# Patient Record
Sex: Female | Born: 1955 | Race: Black or African American | Hispanic: No | Smoking: Never smoker
Health system: Southern US, Community
[De-identification: ages and names within clinical notes are randomized; demographics above are authoritative.]

## PROBLEM LIST (undated history)

## (undated) DIAGNOSIS — C801 Malignant (primary) neoplasm, unspecified: Secondary | ICD-10-CM

## (undated) DIAGNOSIS — G249 Dystonia, unspecified: Secondary | ICD-10-CM

## (undated) HISTORY — PX: CHOLECYSTECTOMY: SHX55

## (undated) HISTORY — PX: MASTECTOMY: SHX3

## (undated) HISTORY — PX: BREAST SURGERY: SHX581

---

## 2021-02-02 ENCOUNTER — Emergency Department
Admission: EM | Admit: 2021-02-02 | Discharge: 2021-02-02 | Disposition: A | Discharge status: Home / Self Care | Payer: PRIVATE HEALTH INSURANCE | Attending: Emergency Medicine | Admitting: Emergency Medicine

## 2021-02-02 ENCOUNTER — Encounter: Payer: Self-pay | Admitting: Emergency Medicine

## 2021-02-02 ENCOUNTER — Other Ambulatory Visit: Payer: Self-pay

## 2021-02-02 ENCOUNTER — Emergency Department: Payer: PRIVATE HEALTH INSURANCE

## 2021-02-02 DIAGNOSIS — Z859 Personal history of malignant neoplasm, unspecified: Secondary | ICD-10-CM | POA: Insufficient documentation

## 2021-02-02 DIAGNOSIS — R55 Syncope and collapse: Secondary | ICD-10-CM | POA: Insufficient documentation

## 2021-02-02 DIAGNOSIS — I739 Peripheral vascular disease, unspecified: Secondary | ICD-10-CM | POA: Insufficient documentation

## 2021-02-02 DIAGNOSIS — Z20822 Contact with and (suspected) exposure to covid-19: Secondary | ICD-10-CM | POA: Diagnosis not present

## 2021-02-02 DIAGNOSIS — R000 Tachycardia, unspecified: Secondary | ICD-10-CM | POA: Diagnosis not present

## 2021-02-02 HISTORY — DX: Dystonia, unspecified: G24.9

## 2021-02-02 HISTORY — DX: Malignant (primary) neoplasm, unspecified: C80.1

## 2021-02-02 LAB — URINALYSIS, ROUTINE W REFLEX MICROSCOPIC
Bacteria, UA: NONE SEEN
Bilirubin Urine: NEGATIVE
Glucose, UA: NEGATIVE mg/dL
Hgb urine dipstick: NEGATIVE
Ketones, ur: NEGATIVE mg/dL
Nitrite: NEGATIVE
Protein, ur: NEGATIVE mg/dL
Specific Gravity, Urine: 1.006 (ref 1.005–1.030)
pH: 6 (ref 5.0–8.0)

## 2021-02-02 LAB — RESP PANEL BY RT-PCR (FLU A&B, COVID) ARPGX2
Influenza A by PCR: NEGATIVE
Influenza B by PCR: NEGATIVE
SARS Coronavirus 2 by RT PCR: NEGATIVE

## 2021-02-02 LAB — COMPREHENSIVE METABOLIC PANEL
ALT: 27 U/L (ref 0–44)
AST: 23 U/L (ref 15–41)
Albumin: 4.4 g/dL (ref 3.5–5.0)
Alkaline Phosphatase: 69 U/L (ref 38–126)
Anion gap: 10 (ref 5–15)
BUN: 15 mg/dL (ref 8–23)
CO2: 29 mmol/L (ref 22–32)
Calcium: 9.9 mg/dL (ref 8.9–10.3)
Chloride: 100 mmol/L (ref 98–111)
Creatinine, Ser: 0.89 mg/dL (ref 0.44–1.00)
GFR, Estimated: >60 mL/min (ref >60)
Glucose, Bld: 151 mg/dL — ABNORMAL HIGH (ref 70–99)
Potassium: 3.5 mmol/L (ref 3.5–5.1)
Sodium: 139 mmol/L (ref 135–145)
Total Bilirubin: 0.9 mg/dL (ref 0.3–1.2)
Total Protein: 7.5 g/dL (ref 6.5–8.1)

## 2021-02-02 LAB — CBC
HCT: 42.9 % (ref 36.0–46.0)
Hemoglobin: 14.9 g/dL (ref 12.0–15.0)
MCH: 31 pg (ref 26.0–34.0)
MCHC: 34.7 g/dL (ref 30.0–36.0)
MCV: 89.4 fL (ref 80.0–100.0)
Platelets: 245 10*3/uL (ref 150–400)
RBC: 4.8 MIL/uL (ref 3.87–5.11)
RDW: 13.3 % (ref 11.5–15.5)
WBC: 7.4 10*3/uL (ref 4.0–10.5)
nRBC: 0 % (ref 0.0–0.2)

## 2021-02-02 LAB — LIPASE, BLOOD: Lipase: 34 U/L (ref 11–51)

## 2021-02-02 LAB — TROPONIN I (HIGH SENSITIVITY): Troponin I (High Sensitivity): 6 ng/L (ref <18)

## 2021-02-02 IMAGING — MR MR MRA HEAD W/O CM
1 series · 19 of 48 positions shown · non-contrast
Comparison: None.

CLINICAL DATA: Nausea and vomiting beginning yesterday.



[Series 5: TOF · axial · 0.5mm · 0.41mm/px · z∈[-57,+39]mm · 19 of 205 slices shown]
[im 1/205]
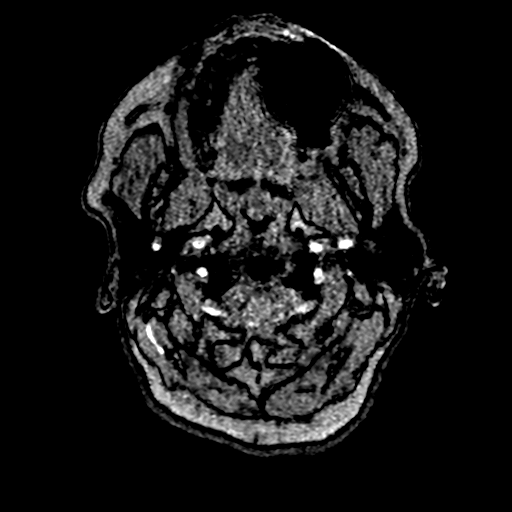
[im 5/205]
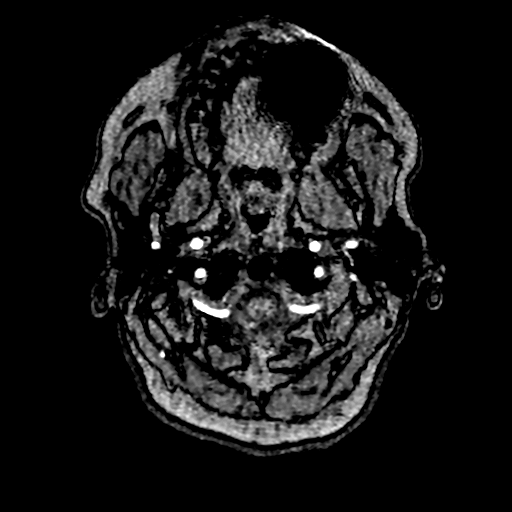
[im 9/205]
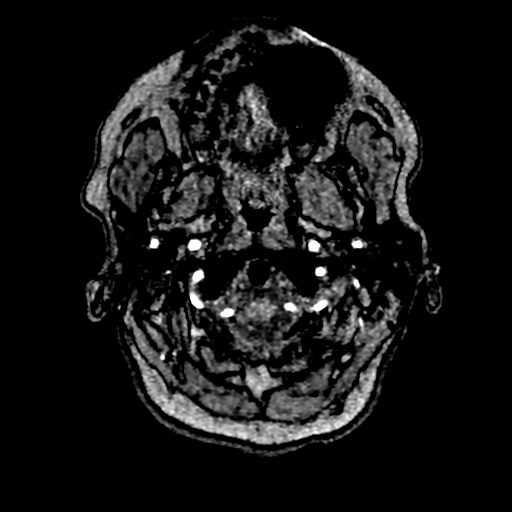
[im 14/205]
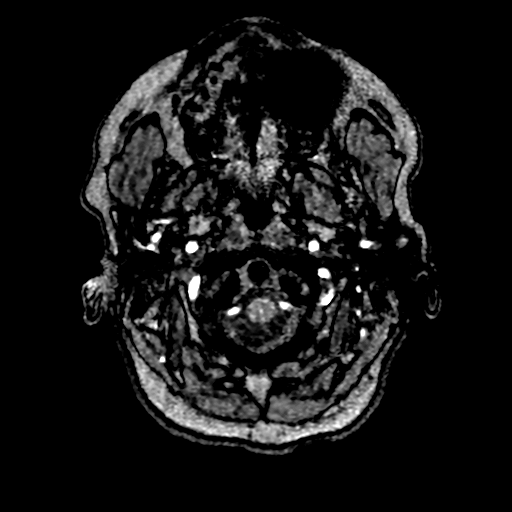
[im 18/205]
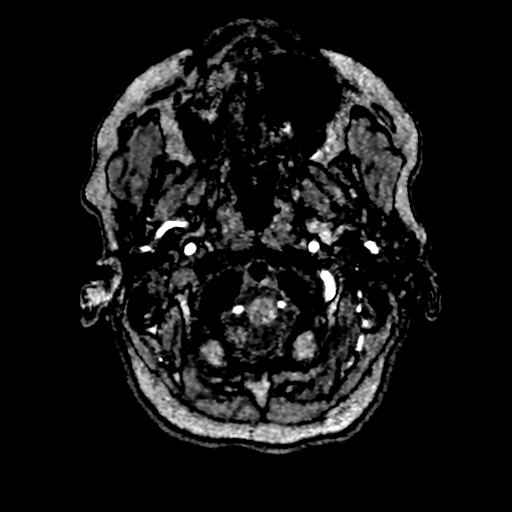
[im 22/205]
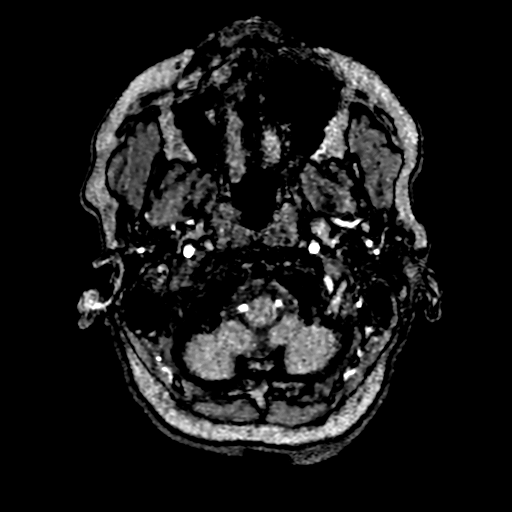
[im 27/205]
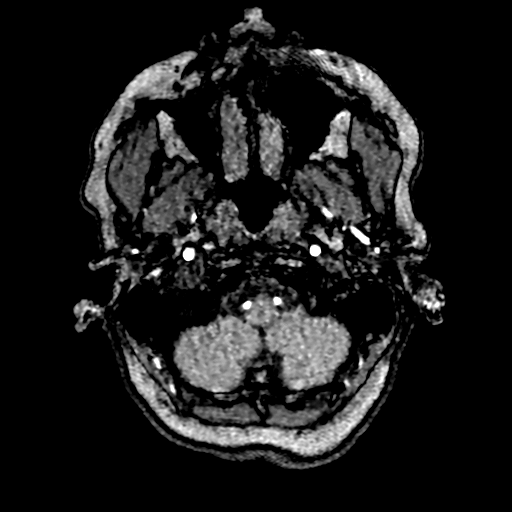
[im 31/205]
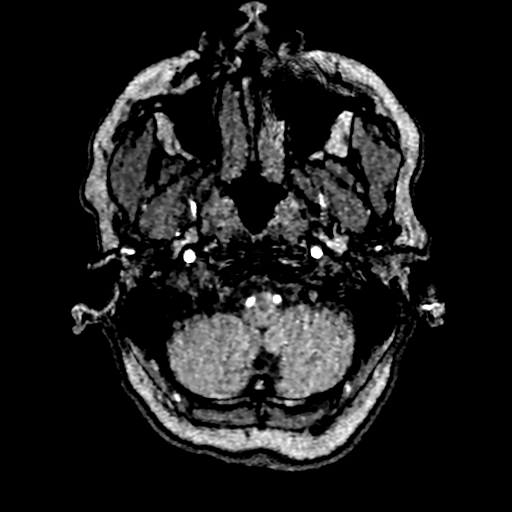
[im 35/205]
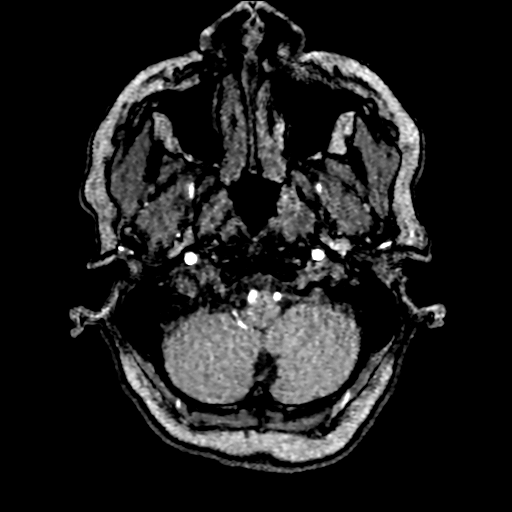
[im 40/205]
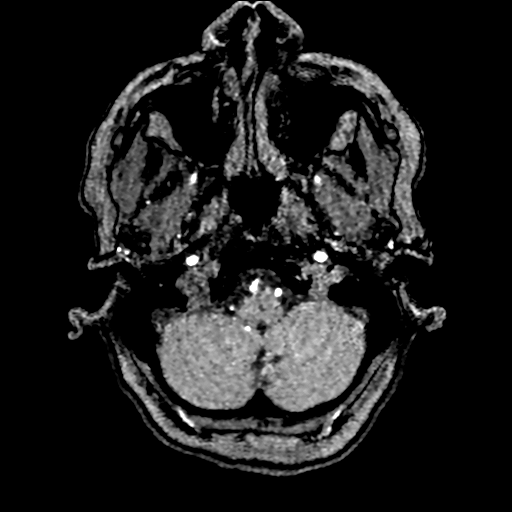
[im 44/205]
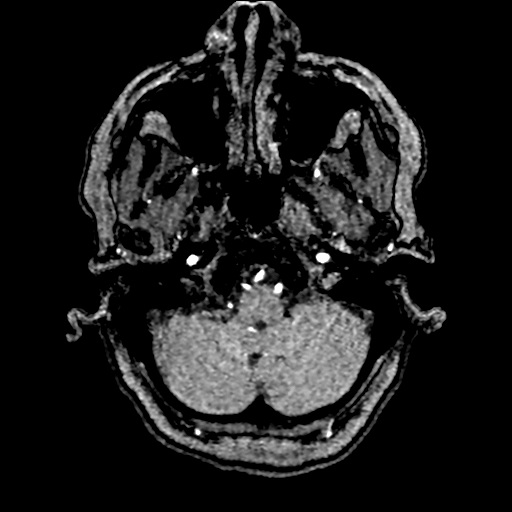
[im 66/205]
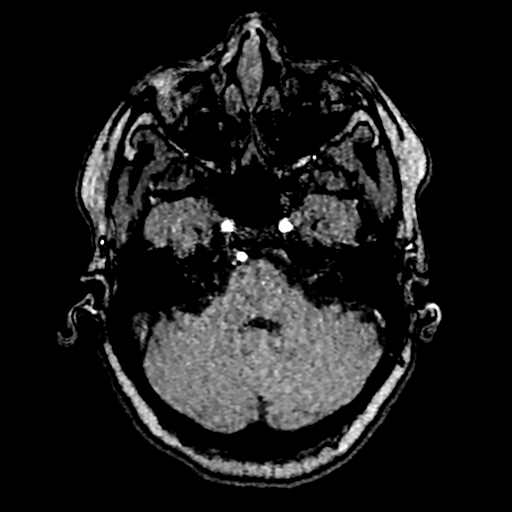
[im 92/205]
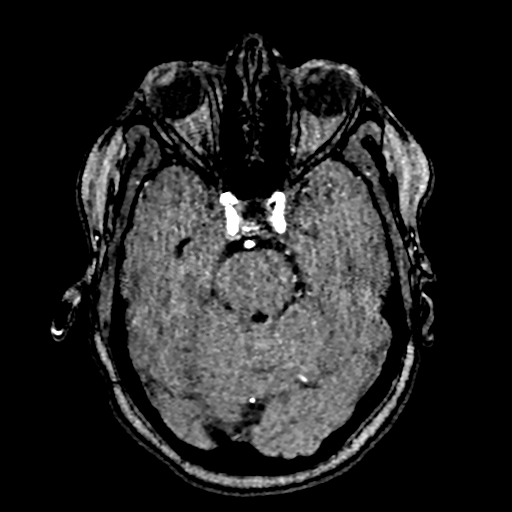
[im 105/205]
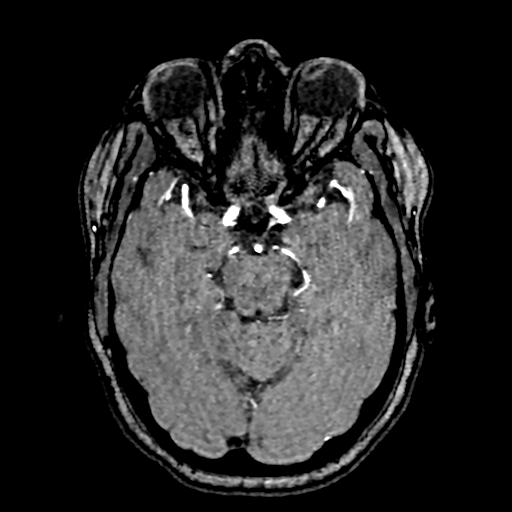
[im 118/205]
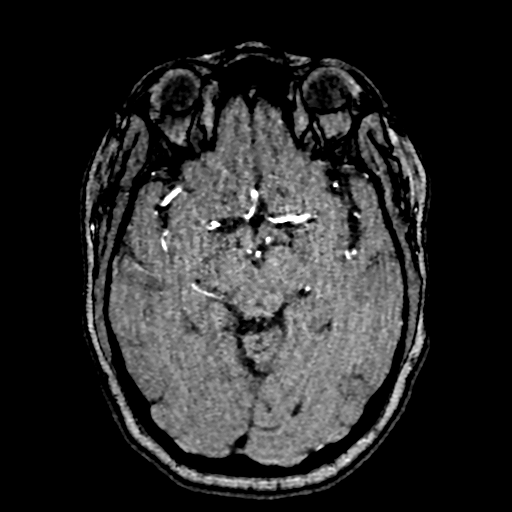
[im 144/205]
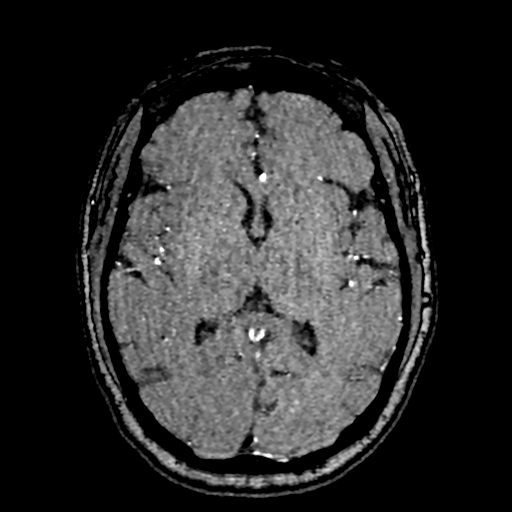
[im 170/205]
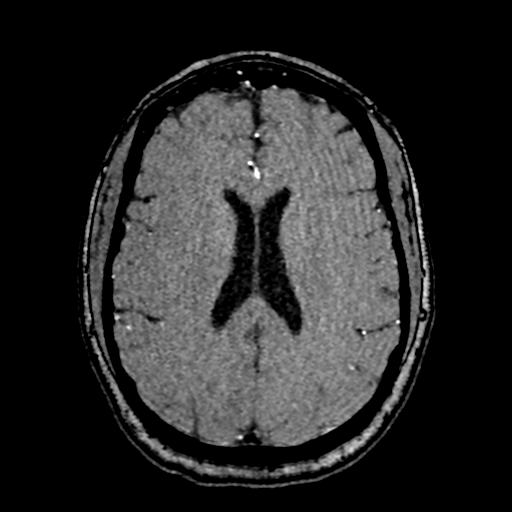
[im 174/205]
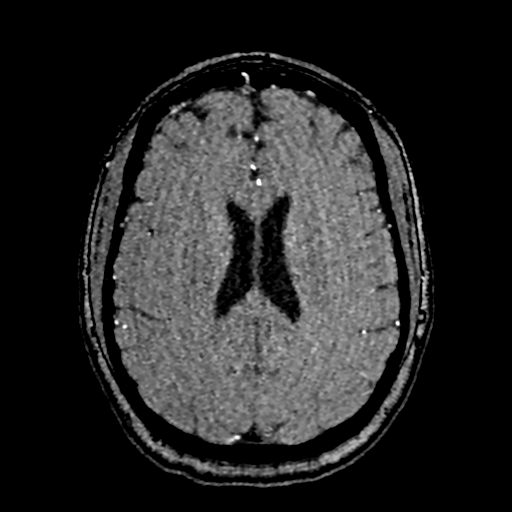
[im 196/205]
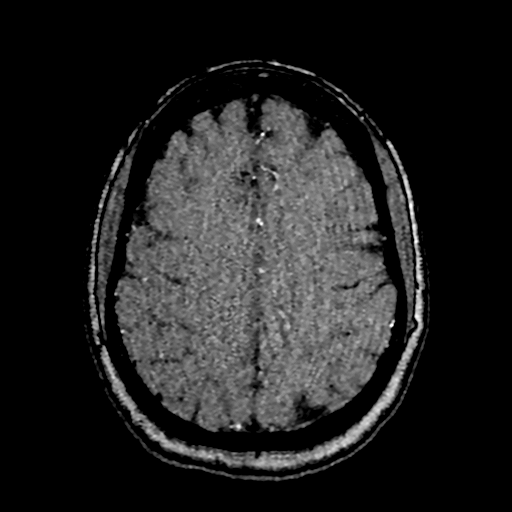

[19 of 48 positions shown; findings below may reference images not displayed]

FINDINGS: MRI HEAD FINDINGS

Brain: Diffusion imaging does not show any acute or subacute
infarction. There chronic small-vessel ischemic changes of the pons.
No focal cerebellar insult. Cerebral hemispheres show moderate
chronic small-vessel ischemic changes of the deep and subcortical
white matter. No cortical or large vessel territory infarction. No
mass lesion, hemorrhage, hydrocephalus or extra-axial collection.

Vascular: Major vessels at the base of the brain show flow.

Skull and upper cervical spine: Negative

Sinuses/Orbits: Clear/normal

Other: None

MRA HEAD FINDINGS

Both internal carotid arteries are widely patent into the brain. No
siphon stenosis. The anterior and middle cerebral vessels are patent
without proximal stenosis, aneurysm or vascular malformation.

Both vertebral arteries are widely patent to the basilar. No basilar
stenosis. Posterior circulation branch vessels appear normal.

MRA NECK FINDINGS

Both common carotid arteries patent from the midportion to the
bifurcations. Both carotid bifurcations appear normal without
stenosis or irregularity. Antegrade flow seen in normal sized
vertebral arteries. Vessel origins not studied using the noncontrast
technique.
IMPRESSION: MRI head: No acute finding. Moderate chronic small-vessel ischemic
changes of the pons and cerebral hemispheric white matter.

MRA head: Normal appearance of the large and medium sized vessels.

MRA neck: No carotid bifurcation disease on either side. Antegrade
flow in normal sized vertebral arteries.

## 2021-02-02 IMAGING — MR MR HEAD W/O CM
13 series · 48 of 48 positions shown · non-contrast
Comparison: None.

CLINICAL DATA: Nausea and vomiting beginning yesterday.



[Series 5: ax dwi_tracew · axial · 3.0mm · 0.65mm/px · z∈[-62,+91]mm · 2 of 48 slices shown]
[im 1/48]
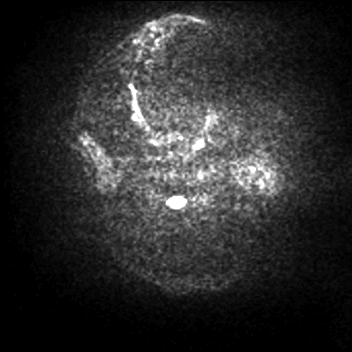
[im 48/48]
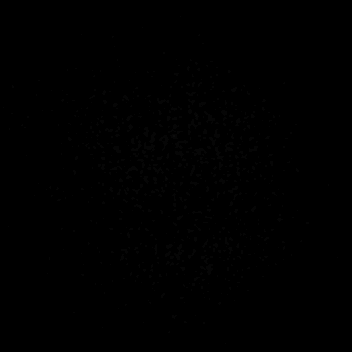

[Series 6: ax dwi_adc · axial · 3.0mm · 0.65mm/px · z∈[-62,+81]mm · 2 of 45 slices shown]
[im 1/45]
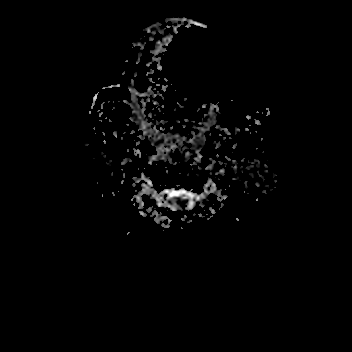
[im 45/45]
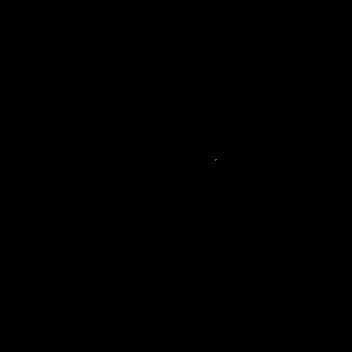

[Series 7: cor dwi_tracew · coronal · 5.0mm · 0.68mm/px · 3 of 40 slices shown]
[im 1/40]
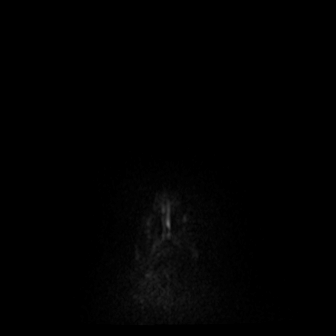
[im 20/40]
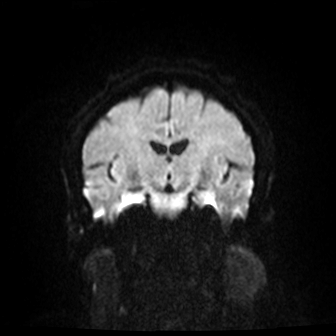
[im 40/40]
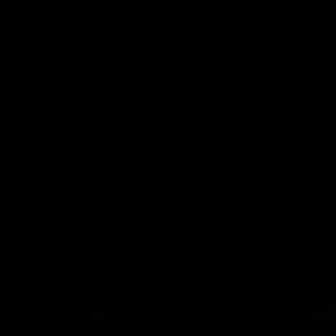

[Series 8: cor dwi_adc · coronal · 5.0mm · 0.68mm/px · 3 of 39 slices shown]
[im 1/39]
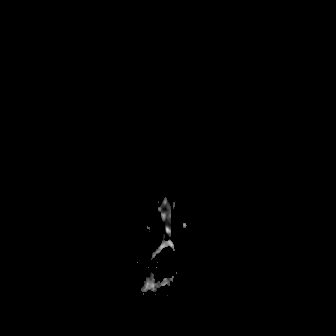
[im 20/39]
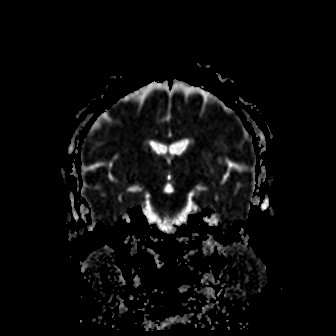
[im 39/39]
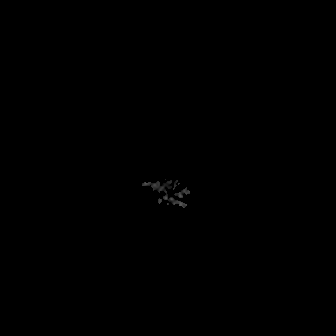

[Series 9: T1 · sagittal · 5.0mm · 0.62mm/px · 2 of 25 slices shown (1 of 2)]
[im 1/25]
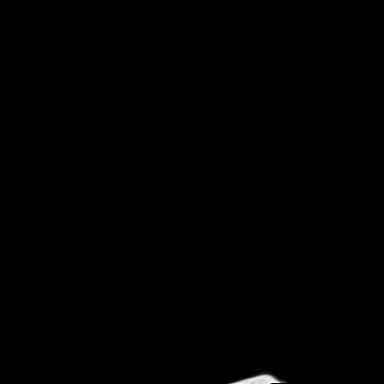
[im 25/25]
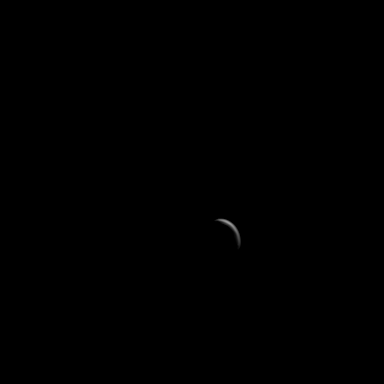

[Series 10: T2 · axial · 5.0mm · 0.53mm/px · z∈[-57,+85]mm · 2 of 25 slices shown (1 of 2)]
[im 1/25]
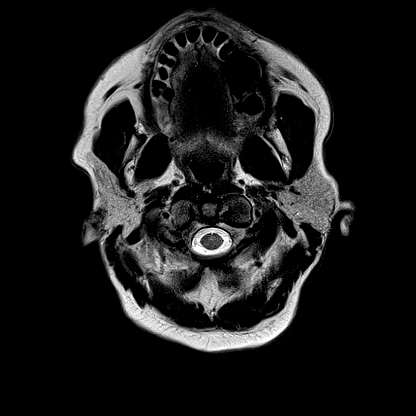
[im 25/25]
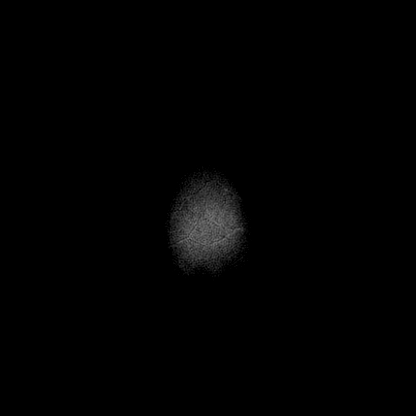

[Series 11: mag_images · axial · 3.0mm · 0.90mm/px · z∈[-74,+100]mm · 4 of 60 slices shown]
[im 1/60]
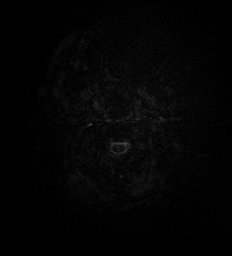
[im 20/60]
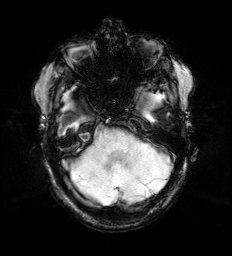
[im 40/60]
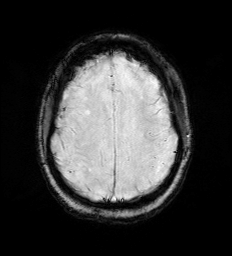
[im 60/60]
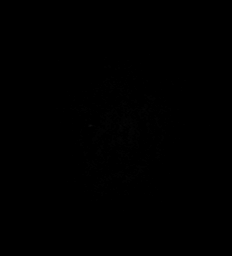

[Series 12: pha_images · axial · 3.0mm · 0.90mm/px · z∈[-74,+100]mm · 4 of 59 slices shown]
[im 1/59]
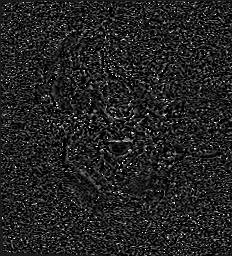
[im 20/59]
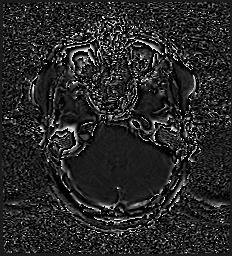
[im 39/59]
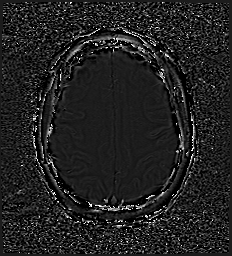
[im 59/59]
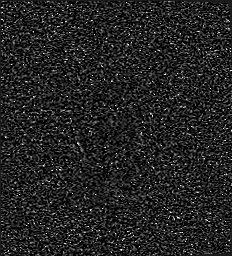

[Series 13: swi_images · axial · 3.0mm · 0.90mm/px · z∈[-74,+100]mm · 4 of 60 slices shown]
[im 1/60]
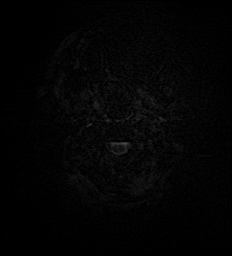
[im 20/60]
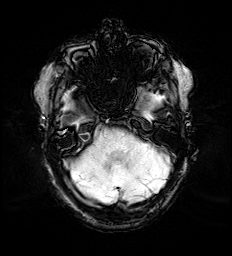
[im 40/60]
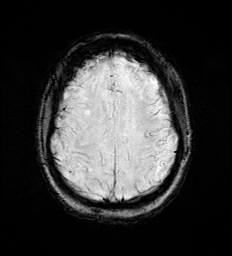
[im 60/60]
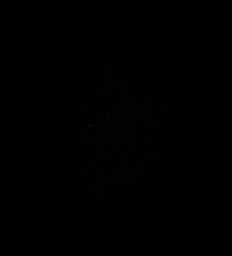

[Series 14: mip_images(sw) · axial · 24.0mm · 0.90mm/px · z∈[-64,+89]mm · 4 of 53 slices shown]
[im 1/53]
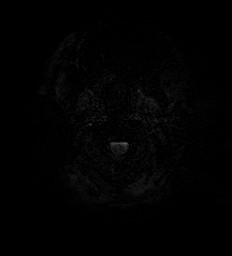
[im 18/53]
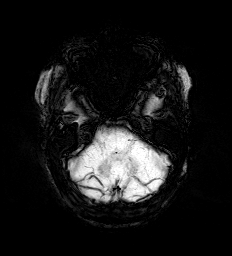
[im 35/53]
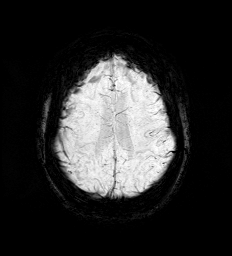
[im 53/53]
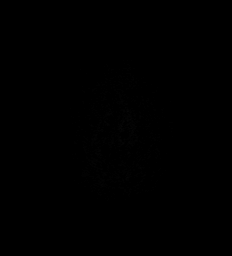

[Series 15: FLAIR · axial · 3.0mm · 0.69mm/px · z∈[-66,+94]mm · 4 of 55 slices shown]
[im 1/55]
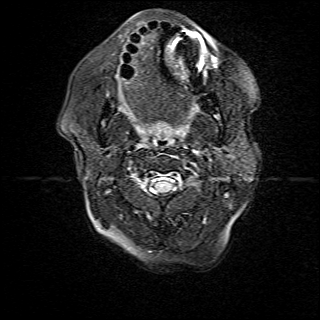
[im 19/55]
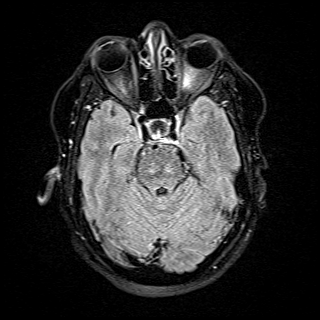
[im 37/55]
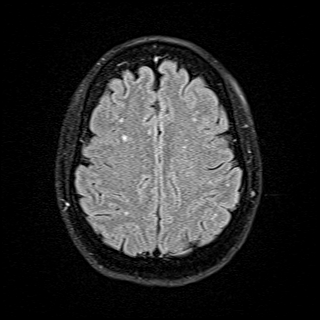
[im 55/55]
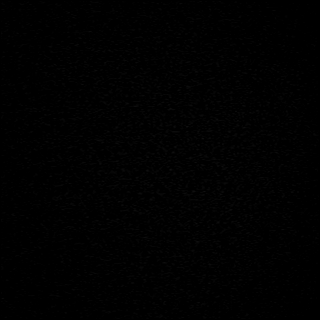

[Series 16: T1 · axial · 1.0mm · 0.98mm/px · z∈[-72,+100]mm · 12 of 176 slices shown (2 of 2)]
[im 1/176]
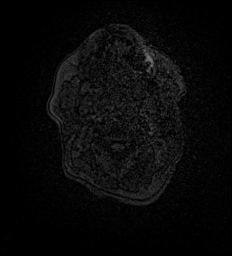
[im 16/176]
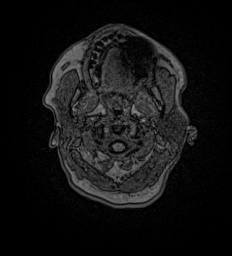
[im 32/176]
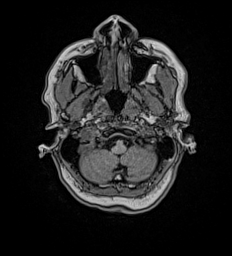
[im 48/176]
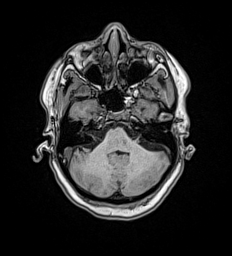
[im 64/176]
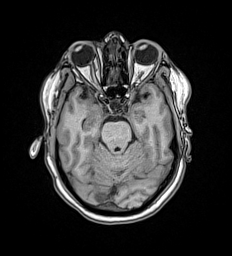
[im 80/176]
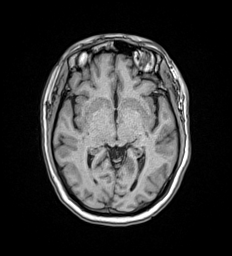
[im 96/176]
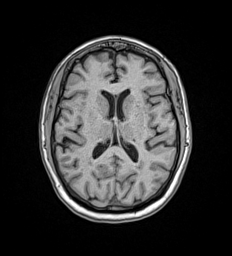
[im 112/176]
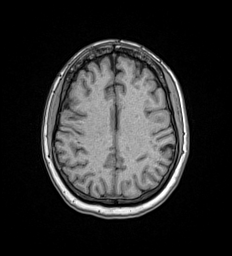
[im 128/176]
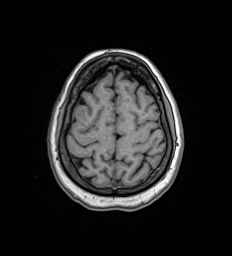
[im 144/176]
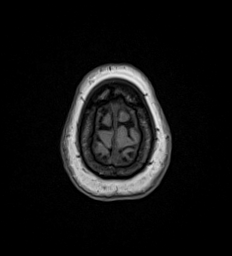
[im 160/176]
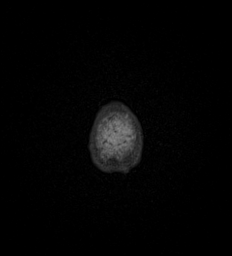
[im 176/176]
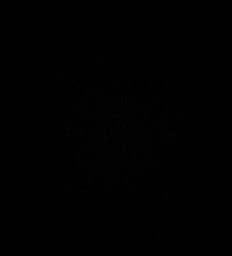

[Series 17: T2 · coronal · 5.0mm · 0.45mm/px · 2 of 31 slices shown (2 of 2)]
[im 1/31]
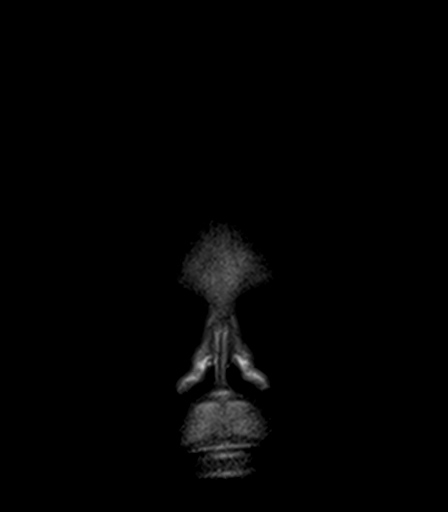
[im 31/31]
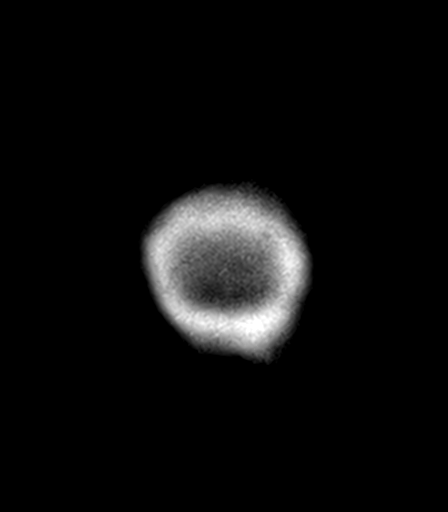

[48 of 48 positions shown; findings below may reference images not displayed]

FINDINGS: MRI HEAD FINDINGS

Brain: Diffusion imaging does not show any acute or subacute
infarction. There chronic small-vessel ischemic changes of the pons.
No focal cerebellar insult. Cerebral hemispheres show moderate
chronic small-vessel ischemic changes of the deep and subcortical
white matter. No cortical or large vessel territory infarction. No
mass lesion, hemorrhage, hydrocephalus or extra-axial collection.

Vascular: Major vessels at the base of the brain show flow.

Skull and upper cervical spine: Negative

Sinuses/Orbits: Clear/normal

Other: None

MRA HEAD FINDINGS

Both internal carotid arteries are widely patent into the brain. No
siphon stenosis. The anterior and middle cerebral vessels are patent
without proximal stenosis, aneurysm or vascular malformation.

Both vertebral arteries are widely patent to the basilar. No basilar
stenosis. Posterior circulation branch vessels appear normal.

MRA NECK FINDINGS

Both common carotid arteries patent from the midportion to the
bifurcations. Both carotid bifurcations appear normal without
stenosis or irregularity. Antegrade flow seen in normal sized
vertebral arteries. Vessel origins not studied using the noncontrast
technique.
IMPRESSION: MRI head: No acute finding. Moderate chronic small-vessel ischemic
changes of the pons and cerebral hemispheric white matter.

MRA head: Normal appearance of the large and medium sized vessels.

MRA neck: No carotid bifurcation disease on either side. Antegrade
flow in normal sized vertebral arteries.

## 2021-02-02 IMAGING — MR MR MRA NECK W/O CM
1 series · 39 of 48 positions shown · non-contrast
Comparison: None.

CLINICAL DATA: Nausea and vomiting beginning yesterday.



[Series 6: TOF · axial · 0.6mm · 0.52mm/px · z∈[-127,-53]mm · 39 of 133 slices shown]
[im 1/133]
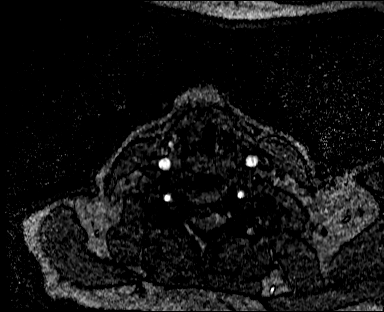
[im 3/133]
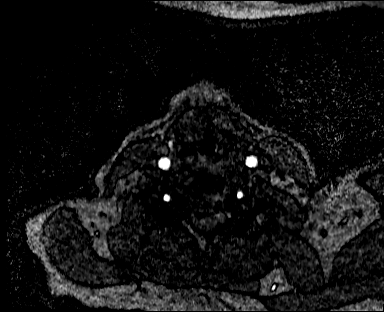
[im 6/133]
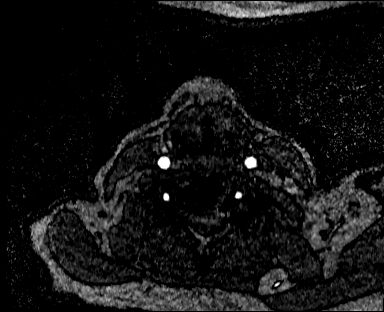
[im 9/133]
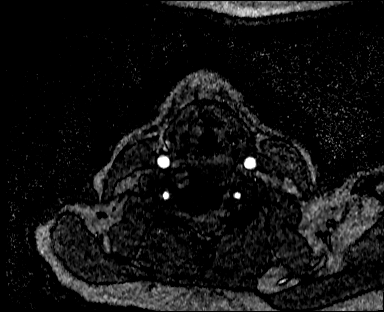
[im 12/133]
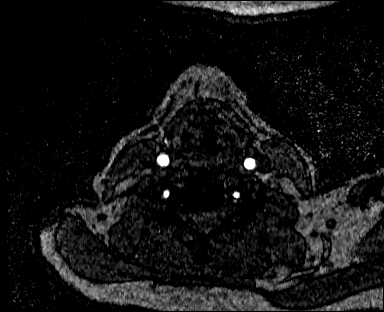
[im 15/133]
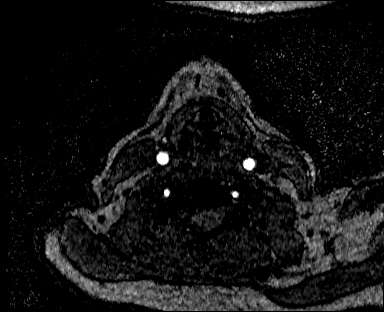
[im 17/133]
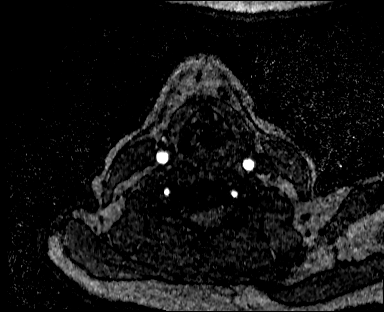
[im 20/133]
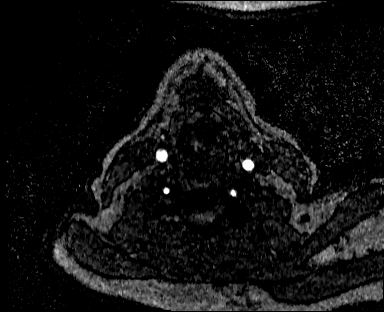
[im 23/133]
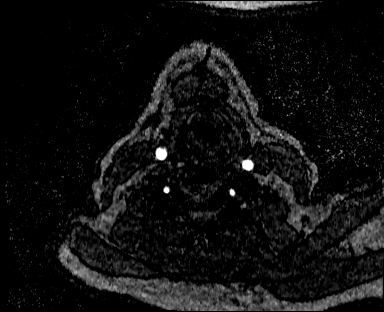
[im 26/133]
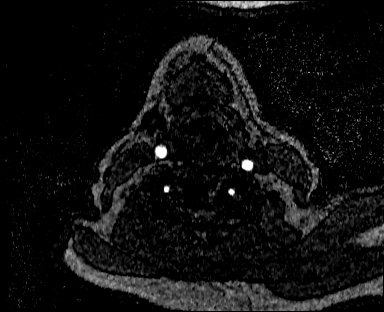
[im 29/133]
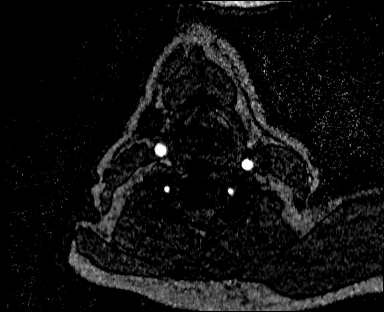
[im 31/133]
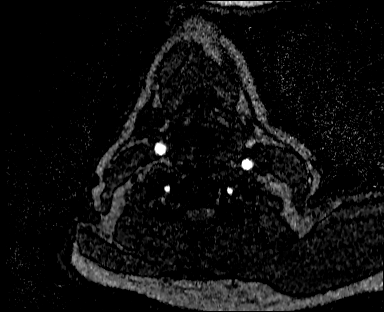
[im 34/133]
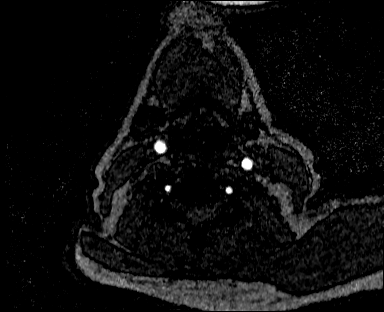
[im 37/133]
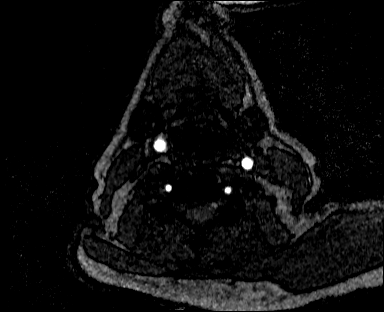
[im 40/133]
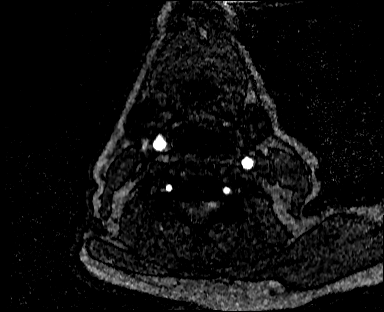
[im 43/133]
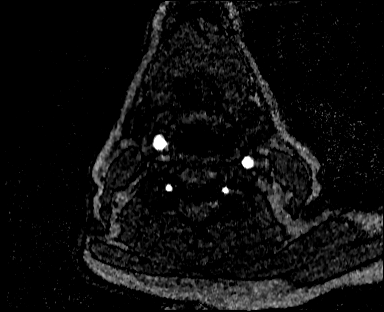
[im 45/133]
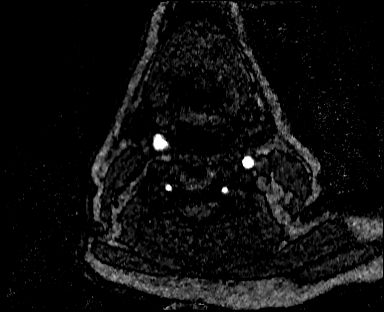
[im 48/133]
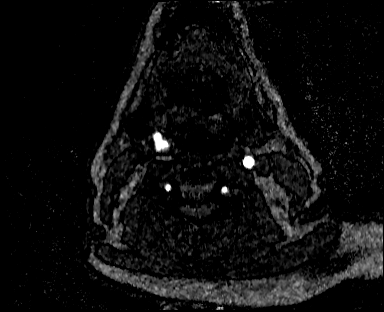
[im 51/133]
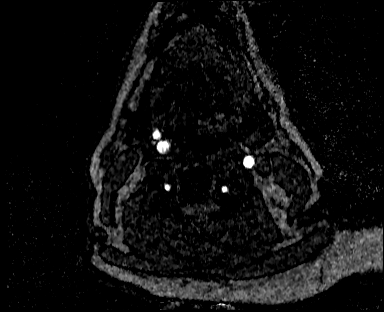
[im 54/133]
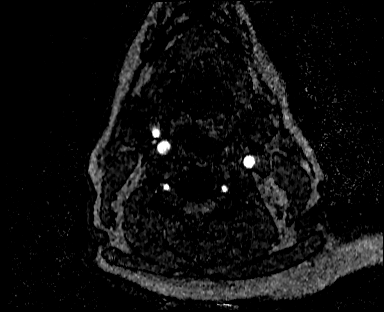
[im 57/133]
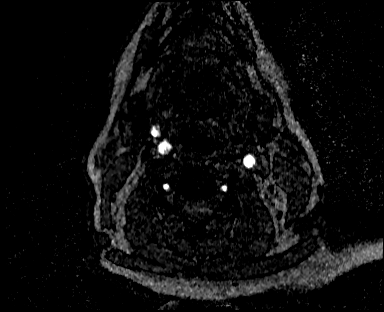
[im 59/133]
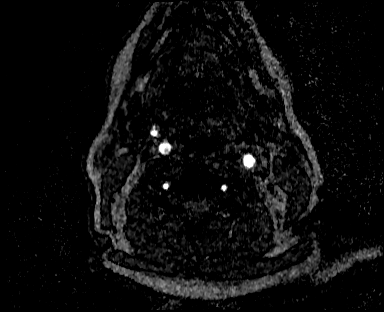
[im 62/133]
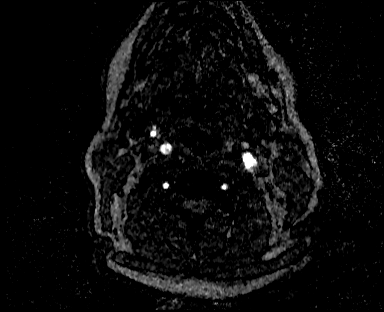
[im 65/133]
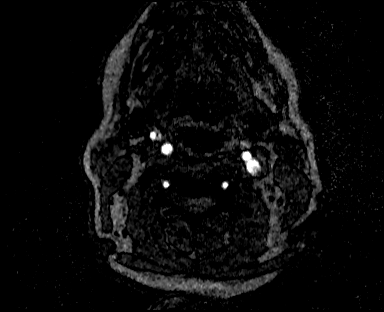
[im 68/133]
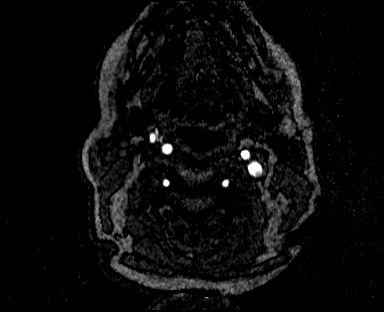
[im 71/133]
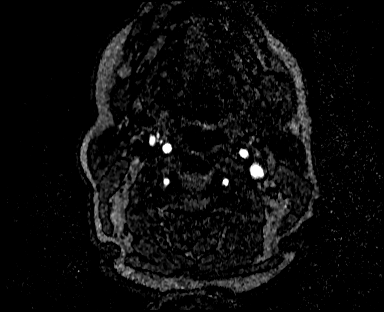
[im 74/133]
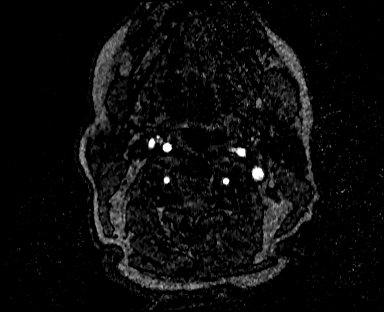
[im 76/133]
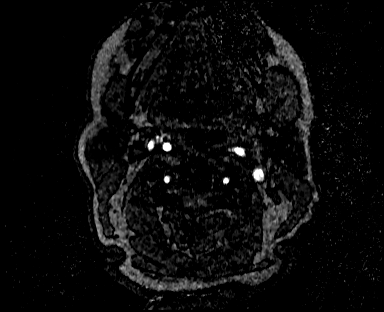
[im 79/133]
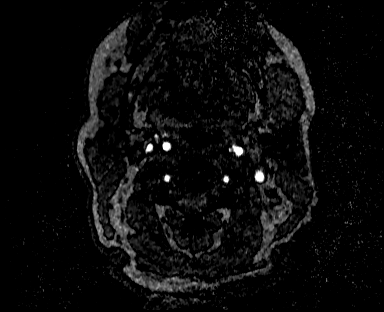
[im 82/133]
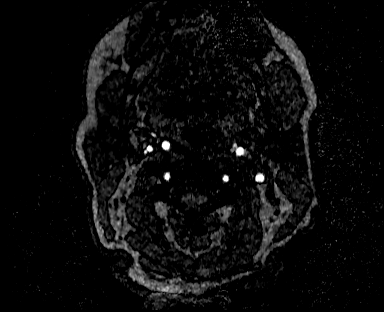
[im 85/133]
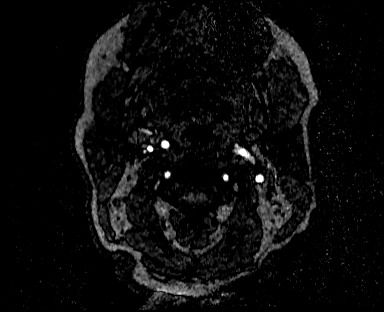
[im 88/133]
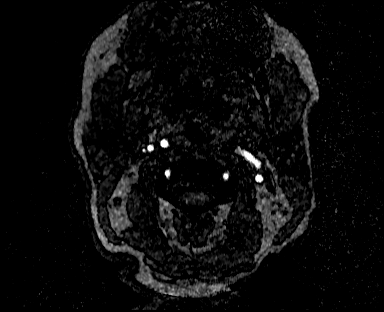
[im 90/133]
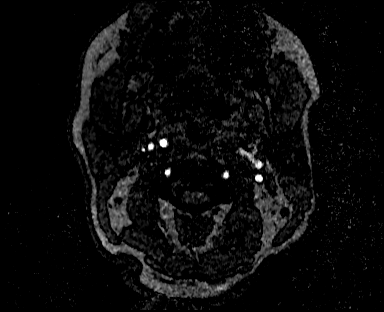
[im 93/133]
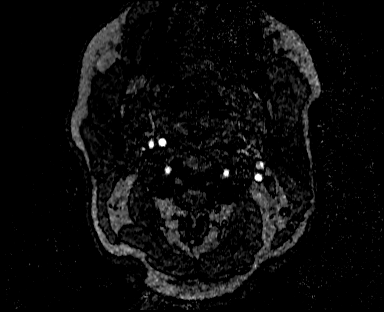
[im 96/133]
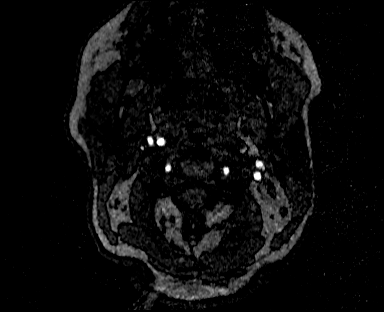
[im 99/133]
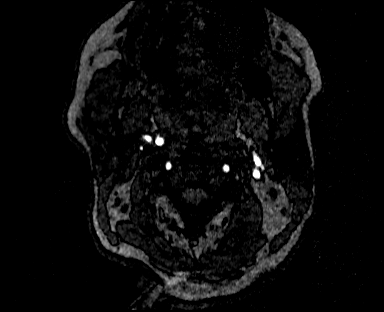
[im 110/133]
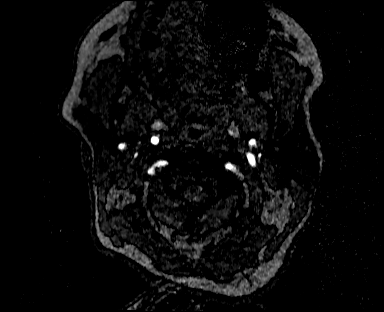
[im 113/133]
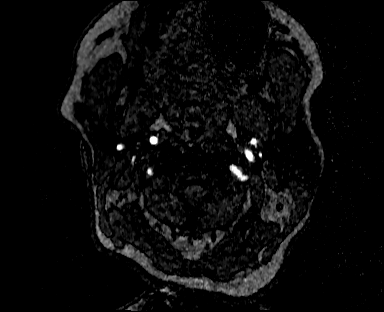
[im 127/133]
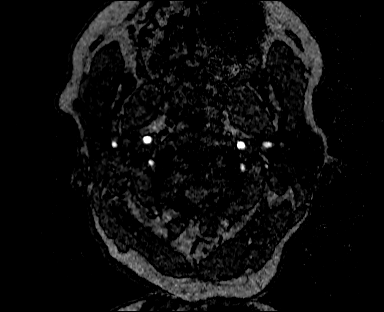

[39 of 48 positions shown; findings below may reference images not displayed]

FINDINGS: MRI HEAD FINDINGS

Brain: Diffusion imaging does not show any acute or subacute
infarction. There chronic small-vessel ischemic changes of the pons.
No focal cerebellar insult. Cerebral hemispheres show moderate
chronic small-vessel ischemic changes of the deep and subcortical
white matter. No cortical or large vessel territory infarction. No
mass lesion, hemorrhage, hydrocephalus or extra-axial collection.

Vascular: Major vessels at the base of the brain show flow.

Skull and upper cervical spine: Negative

Sinuses/Orbits: Clear/normal

Other: None

MRA HEAD FINDINGS

Both internal carotid arteries are widely patent into the brain. No
siphon stenosis. The anterior and middle cerebral vessels are patent
without proximal stenosis, aneurysm or vascular malformation.

Both vertebral arteries are widely patent to the basilar. No basilar
stenosis. Posterior circulation branch vessels appear normal.

MRA NECK FINDINGS

Both common carotid arteries patent from the midportion to the
bifurcations. Both carotid bifurcations appear normal without
stenosis or irregularity. Antegrade flow seen in normal sized
vertebral arteries. Vessel origins not studied using the noncontrast
technique.
IMPRESSION: MRI head: No acute finding. Moderate chronic small-vessel ischemic
changes of the pons and cerebral hemispheric white matter.

MRA head: Normal appearance of the large and medium sized vessels.

MRA neck: No carotid bifurcation disease on either side. Antegrade
flow in normal sized vertebral arteries.

## 2021-02-02 NOTE — ED Provider Notes (Signed)
[facility] Emergency Department Provider Note  Time seen: 8:52 AM  I have reviewed the triage vital signs and the nursing notes.   HISTORY  Chief Complaint Near Syncope   HPI Alison Dalton is a 65 y.o. female with a past medical history of cancer, presents to the emergency department for near syncope.  According to the patient last night she had a weird sensation in her head followed by a near syncopal episode where she felt like she was going to pass out/get lightheaded.  Patient states she has continued to feel intermittently lightheaded since and this morning she was nauseated and had an episode of vomiting.  Denies any headache or head pain currently.  Denies any weakness or numbness of any arm or leg at any point.  No history of aneurysms.  Denies chest pain or palpitations at any point.  No shortness of breath cough congestion or fever.  Past Medical History:  Diagnosis Date   Cancer ([place])    Dystonia     There are no problems to display for this patient.   Past Surgical History:  Procedure Laterality Date   BREAST SURGERY     CHOLECYSTECTOMY     MASTECTOMY Left     Prior to Admission medications   Not on File    Allergies  Allergen Reactions   Contrast Media [Iodinated Diagnostic Agents]    Penicillins     No family history on file.  Social History Social History   Tobacco Use   Smoking status: Never   Smokeless tobacco: Never  Substance Use Topics   Alcohol use: Not Currently   Drug use: Never    Review of Systems Constitutional: Negative for fever.  Lightheaded at times. Cardiovascular: Negative for chest pain. Respiratory: Negative for shortness of breath. Gastrointestinal: Negative for abdominal pain.  Nausea and vomiting this morning. Genitourinary: Negative for urinary compaints Musculoskeletal: Negative for musculoskeletal complaints Neurological: Strange had discomfort last night followed by near syncope, none  since. All other ROS negative  ____________________________________________   PHYSICAL EXAM:  VITAL SIGNS: ED Triage Vitals  Enc Vitals Group     BP 02/02/21 0811 (!) 160/95     Pulse Rate 02/02/21 0811 88     Resp 02/02/21 0811 16     Temp 02/02/21 0811 97.8 F (36.6 C)     Temp Source 02/02/21 0811 Oral     SpO2 02/02/21 0811 98 %     Weight 02/02/21 0812 136 lb (61.7 kg)     Height 02/02/21 0812 5' 1.5" (1.562 m)     Head Circumference --      Peak Flow --      Pain Score 02/02/21 0812 0     Pain Loc --      Pain Edu? --      Excl. in [place]? --    Constitutional: Alert and oriented. Well appearing and in no distress. Eyes: Normal exam ENT      Head: Normocephalic and atraumatic.      Mouth/Throat: Mucous membranes are moist. Cardiovascular: Normal rate, regular rhythm.  Respiratory: Normal respiratory effort without tachypnea nor retractions. Breath sounds are clear Gastrointestinal: Soft and nontender. No distention.  Musculoskeletal: Nontender with normal range of motion in all extremities.  Neurologic:  Normal speech and language. No gross focal neurologic deficits  Skin:  Skin is warm, dry and intact.  Psychiatric: Mood and affect are normal.  ____________________________________________    EKG  EKG viewed and interpreted by  myself shows sinus tachycardia 101 bpm with a narrow QRS, normal axis, normal intervals, no concerning ST changes.  ____________________________________________    RADIOLOGY  IMPRESSION:  MRI head: No acute finding. Moderate chronic small-vessel ischemic  changes of the pons and cerebral hemispheric white matter.   MRA head: Normal appearance of the large and medium sized vessels.   MRA neck: No carotid bifurcation disease on either side. Antegrade  flow in normal sized vertebral arteries.    ____________________________________________   INITIAL IMPRESSION / ASSESSMENT AND PLAN / ED COURSE  Pertinent labs & imaging results  that were available during my care of the patient were reviewed by me and considered in my medical decision making (see chart for details).   Patient presents emergency department after a strange sensation to her head last night followed by a near syncope episode.  Patient states that she has continued to have intermittent lightheadedness/dizziness ever since.  Patient felt nauseated this morning had an episode of vomiting.  No abdominal pain, no chest pain or palpitations.  Denies any headache weakness or numbness.  Overall the patient appears well with a reassuring physical exam.  Given the patient's description however I would like to obtain imaging of the head to rule out aneurysm, she has an iodine allergy, we will proceed with MRA.  Denies any headache or pains currently.  We will check labs including cardiac enzymes and obtain an EKG.  Patient's work-up is reassuring.  Lab work has come back largely within normal limits.  MRI/MRA is normal.  Given the patient's reassuring work-up I do believe she is safe for discharge home with PCP follow-up.  Patient is requesting a COVID test prior to discharge.  Patient will follow-up on the results.  Discussed follow-up with her PCP on Monday.  Discussed return precautions.  Patient agreeable to plan of care.  Alison Dalton was evaluated in Emergency Department on 02/02/2021 for the symptoms described in the history of present illness. She was evaluated in the context of the global COVID-19 pandemic, which necessitated consideration that the patient might be at risk for infection with the SARS-CoV-2 virus that causes COVID-19. Institutional protocols and algorithms that pertain to the evaluation of patients at risk for COVID-19 are in a state of rapid change based on information released by regulatory bodies including the CDC and federal and state organizations. These policies and algorithms were followed during the patient's care in the  ED.  ____________________________________________   FINAL CLINICAL IMPRESSION(S) / ED DIAGNOSES  Near syncope    Harvest Dark, MD 02/02/21 1216

## 2021-02-02 NOTE — ED Notes (Signed)
Pt oob to toilet with 1 light assist.

## 2021-02-02 NOTE — ED Triage Notes (Signed)
C/O feeling a strange felling in head last night and feeling like she was about to pass out.  States symptoms persist this morning and also feeling nauseated and vomiting x 4 yellow emesis.  AAOx3.  Skin warm and dry. NAD

## 2021-02-02 NOTE — ED Triage Notes (Signed)
Pt reports last night before bed she started feeling nauseated and her head felt weird. Pt reports this am she felt worse and has vomited x's 4. Pt went to bed at 22:00 and awoke at 06:30 this am

## 2021-02-02 NOTE — Discharge Instructions (Signed)
As we discussed please follow-up with your PCP on Monday or Tuesday for recheck/reevaluation.  Please drink plenty of fluids and obtain plenty of rest over the weekend.  Return to the emergency department for any further near syncopal/syncopal events, any further significant lightheadedness, fever or any other symptom personally concerning to yourself.
